# Patient Record
Sex: Female | Born: 1935 | Race: White | Hispanic: No | Smoking: Former smoker
Health system: Southern US, Community
[De-identification: ages and names within clinical notes are randomized; demographics above are authoritative.]

## PROBLEM LIST (undated history)

## (undated) DIAGNOSIS — K5900 Constipation, unspecified: Secondary | ICD-10-CM

## (undated) DIAGNOSIS — K449 Diaphragmatic hernia without obstruction or gangrene: Secondary | ICD-10-CM

## (undated) DIAGNOSIS — K319 Disease of stomach and duodenum, unspecified: Secondary | ICD-10-CM

## (undated) DIAGNOSIS — F419 Anxiety disorder, unspecified: Secondary | ICD-10-CM

## (undated) DIAGNOSIS — I255 Ischemic cardiomyopathy: Secondary | ICD-10-CM

## (undated) DIAGNOSIS — R51 Headache: Secondary | ICD-10-CM

## (undated) DIAGNOSIS — I5022 Chronic systolic (congestive) heart failure: Secondary | ICD-10-CM

## (undated) DIAGNOSIS — R42 Dizziness and giddiness: Secondary | ICD-10-CM

## (undated) DIAGNOSIS — N289 Disorder of kidney and ureter, unspecified: Secondary | ICD-10-CM

## (undated) DIAGNOSIS — Z950 Presence of cardiac pacemaker: Secondary | ICD-10-CM

## (undated) DIAGNOSIS — R079 Chest pain, unspecified: Secondary | ICD-10-CM

## (undated) DIAGNOSIS — I10 Essential (primary) hypertension: Secondary | ICD-10-CM

## (undated) DIAGNOSIS — R519 Headache, unspecified: Secondary | ICD-10-CM

## (undated) DIAGNOSIS — Z889 Allergy status to unspecified drugs, medicaments and biological substances status: Secondary | ICD-10-CM

## (undated) DIAGNOSIS — M1990 Unspecified osteoarthritis, unspecified site: Secondary | ICD-10-CM

## (undated) HISTORY — DX: Headache, unspecified: R51.9

## (undated) HISTORY — DX: Anxiety disorder, unspecified: F41.9

## (undated) HISTORY — DX: Diaphragmatic hernia without obstruction or gangrene: K44.9

## (undated) HISTORY — DX: Dizziness and giddiness: R42

## (undated) HISTORY — DX: Disorder of kidney and ureter, unspecified: N28.9

## (undated) HISTORY — DX: Chest pain, unspecified: R07.9

## (undated) HISTORY — PX: CARDIAC CATHETERIZATION: SHX172

## (undated) HISTORY — PX: OTHER SURGICAL HISTORY: SHX169

## (undated) HISTORY — DX: Constipation, unspecified: K59.00

## (undated) HISTORY — DX: Essential (primary) hypertension: I10

## (undated) HISTORY — DX: Headache: R51

## (undated) HISTORY — DX: Chronic systolic (congestive) heart failure: I50.22

## (undated) HISTORY — DX: Unspecified osteoarthritis, unspecified site: M19.90

## (undated) HISTORY — DX: Ischemic cardiomyopathy: I25.5

## (undated) HISTORY — PX: CORONARY ARTERY BYPASS GRAFT: SHX141

## (undated) HISTORY — DX: Allergy status to unspecified drugs, medicaments and biological substances: Z88.9

## (undated) HISTORY — DX: Presence of cardiac pacemaker: Z95.0

## (undated) HISTORY — DX: Disease of stomach and duodenum, unspecified: K31.9

---

## 2003-09-10 ENCOUNTER — Other Ambulatory Visit: Payer: Self-pay

## 2003-11-10 ENCOUNTER — Ambulatory Visit (HOSPITAL_COMMUNITY): Admission: RE | Admit: 2003-11-10 | Discharge: 2003-11-11 | Payer: Self-pay | Admitting: *Deleted

## 2005-01-06 ENCOUNTER — Other Ambulatory Visit: Payer: Self-pay

## 2005-01-06 ENCOUNTER — Inpatient Hospital Stay: Payer: Self-pay | Admitting: Internal Medicine

## 2005-02-19 ENCOUNTER — Ambulatory Visit: Payer: Self-pay | Admitting: Internal Medicine

## 2005-05-01 ENCOUNTER — Other Ambulatory Visit: Payer: Self-pay

## 2005-05-02 ENCOUNTER — Inpatient Hospital Stay: Payer: Self-pay | Admitting: Internal Medicine

## 2005-09-05 ENCOUNTER — Other Ambulatory Visit: Payer: Self-pay

## 2005-09-05 ENCOUNTER — Emergency Department: Payer: Self-pay | Admitting: Emergency Medicine

## 2005-10-04 ENCOUNTER — Ambulatory Visit: Payer: Self-pay | Admitting: Unknown Physician Specialty

## 2005-11-22 ENCOUNTER — Ambulatory Visit: Payer: Self-pay | Admitting: Internal Medicine

## 2005-12-11 ENCOUNTER — Inpatient Hospital Stay (HOSPITAL_COMMUNITY): Admission: RE | Admit: 2005-12-11 | Discharge: 2005-12-12 | Payer: Self-pay | Admitting: Internal Medicine

## 2005-12-11 ENCOUNTER — Encounter: Payer: Self-pay | Admitting: Cardiology

## 2005-12-11 ENCOUNTER — Ambulatory Visit: Payer: Self-pay | Admitting: Internal Medicine

## 2005-12-27 ENCOUNTER — Ambulatory Visit: Payer: Self-pay

## 2006-10-14 IMAGING — CR DG CHEST 2V
1 series · 2 of 2 positions shown · non-contrast
Comparison: none

REASON FOR EXAM: Facial numbness, dizziness
COMMENTS:  LMP: Post-Menopausal

PROCEDURE:     DXR - DXR CHEST PA (OR AP) AND LATERAL  - January 06, 2005  [DATE]
RESULT:
CLINICAL:  Facial numbness, dizziness, and chest pain.

[Series 1: view not recorded · 0.17mm/px · 2 of 2 slices shown]
[im 1/2]
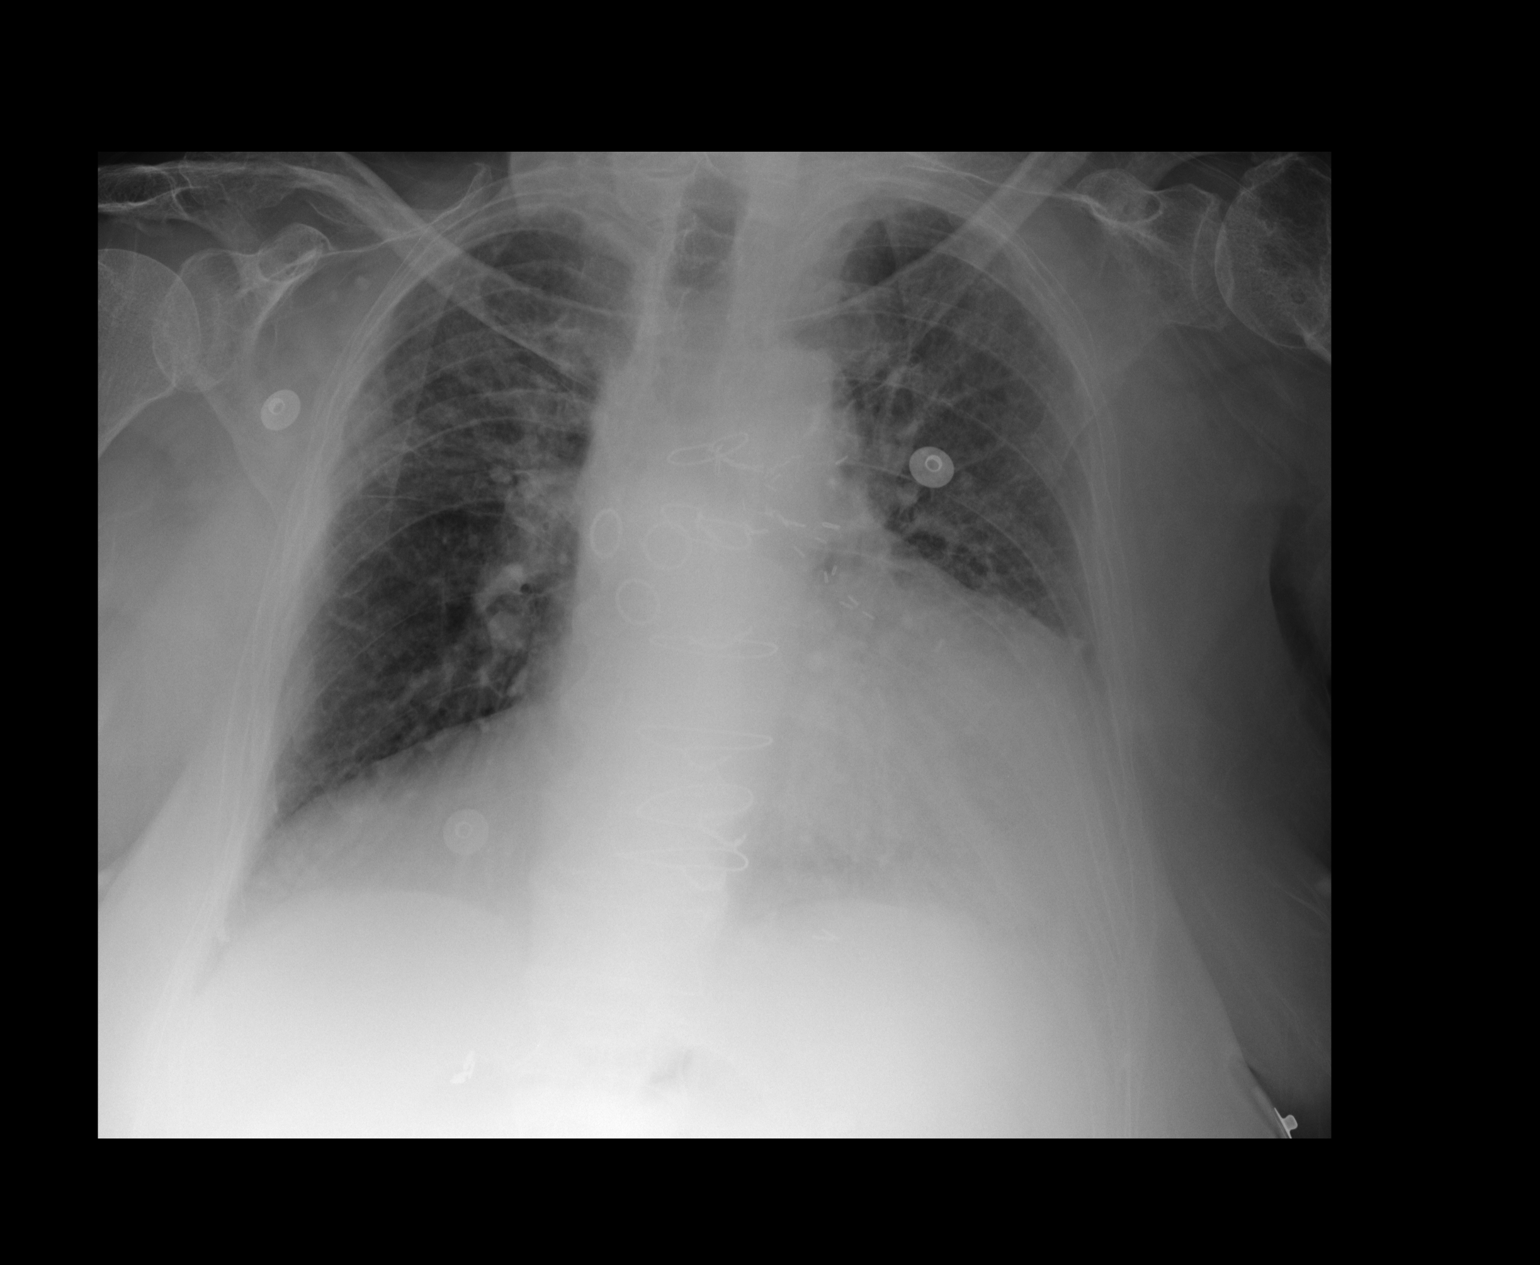
[im 2/2]
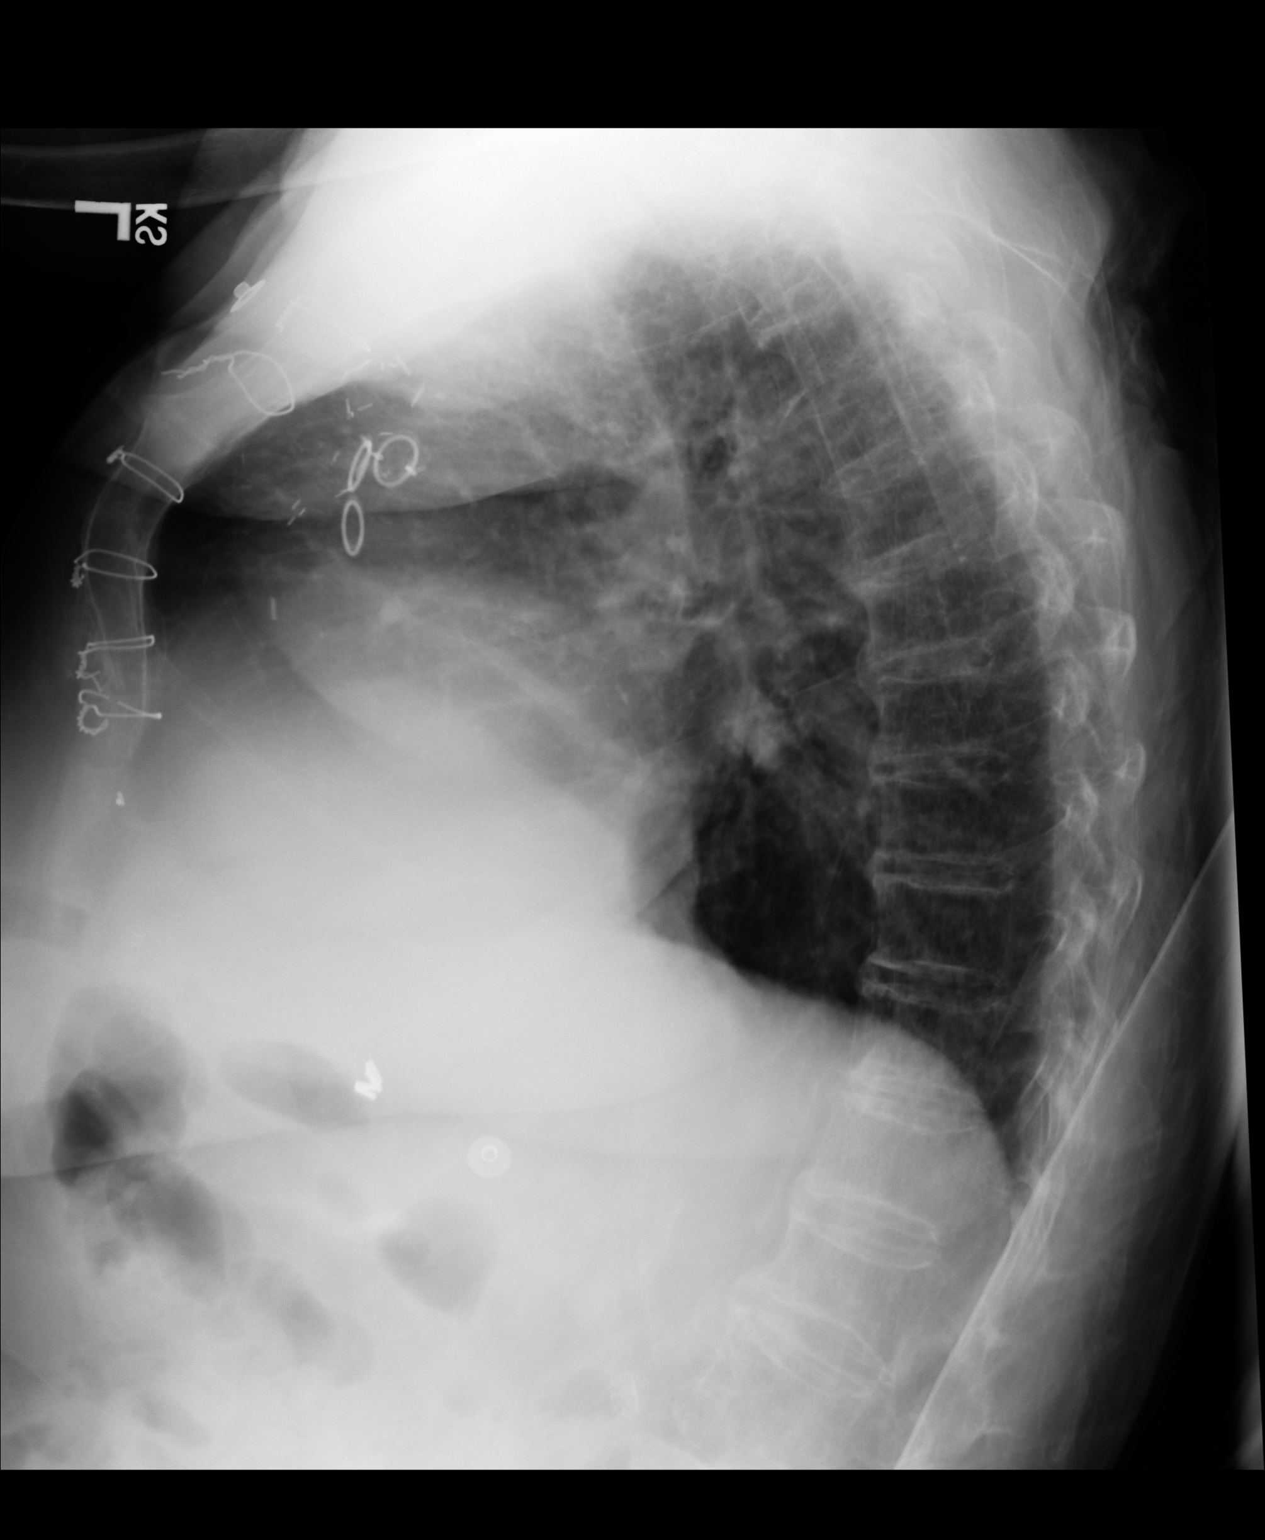

[2 of 2 positions shown; findings below may reference images not displayed]

FINDINGS: AP and lateral views of the chest are compared with the most
recent prior examinations of 11-13-03.

As before there are changes of prior median sternotomy and CABG.  The heart
is markedly enlarged.  Minimal pulmonary vascular congestion remains.  No
focal infiltrate.  No acute fractures.  Deformity of the LEFT humeral head
is again noted.
IMPRESSION: Cardiomegaly and minimal pulmonary edema.  No overt CHF.  No pneumonia.

## 2006-10-14 IMAGING — CT CT HEAD WITHOUT CONTRAST
2 series · 16 of 30 positions shown, 20 images · non-contrast
Comparison: none

REASON FOR EXAM: Facial numbness, dizziness
COMMENTS:  LMP: Post-Menopausal

PROCEDURE:     CT  - CT HEAD WITHOUT CONTRAST  - January 06, 2005  [DATE]
RESULT:
HISTORY: 82-year-old female with facial numbness and dizziness.
TECHNIQUE: Routine unenhanced CT of the head was performed.

[Series 2: without · axial · non-contrast · 0.41mm/px · z∈[-88,+32]mm · 13 of 28 slices shown, 17 images]
[im 2/28  brain]
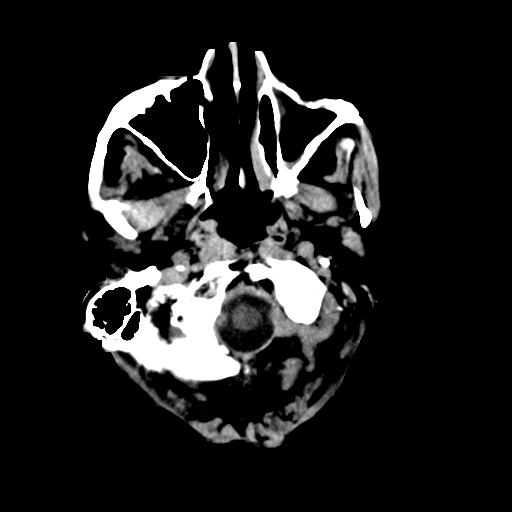
[im 2/28  bone]
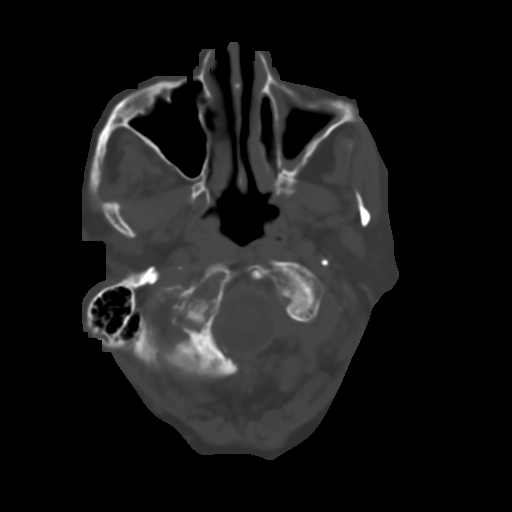
[im 4/28  brain]
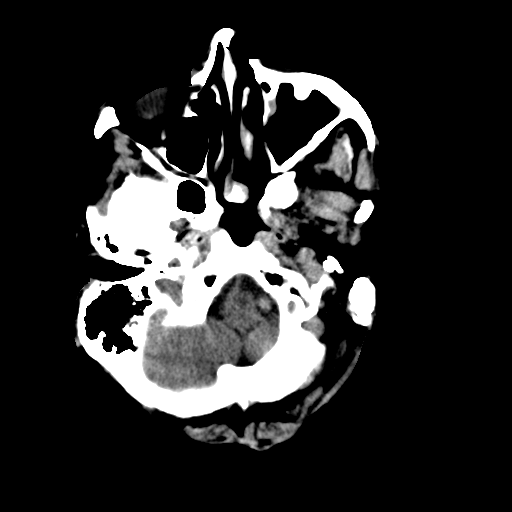
[im 6/28  brain]
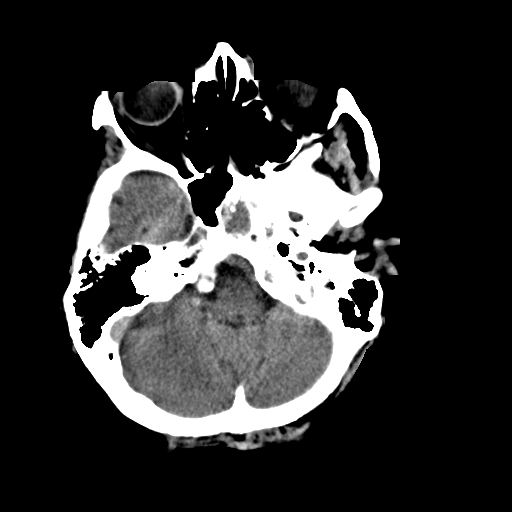
[im 8/28  brain]
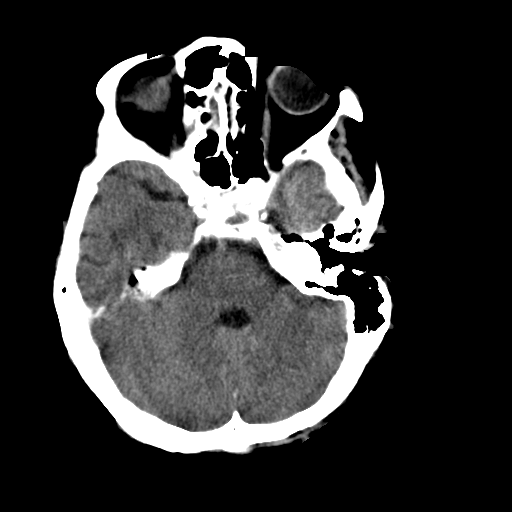
[im 10/28  brain]
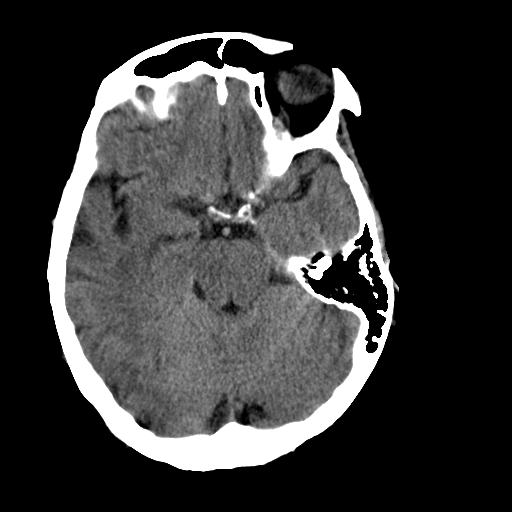
[im 10/28  bone]
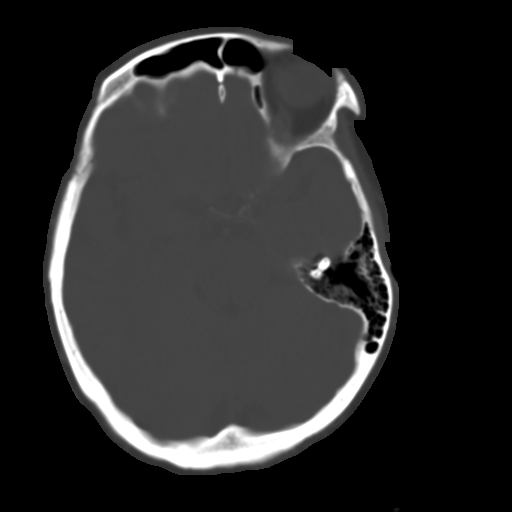
[im 12/28  brain]
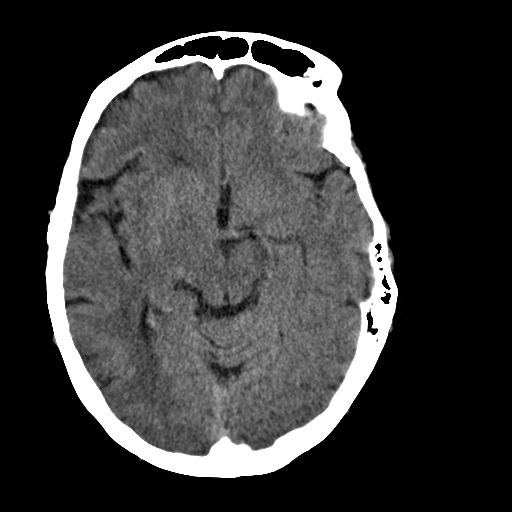
[im 14/28  brain]
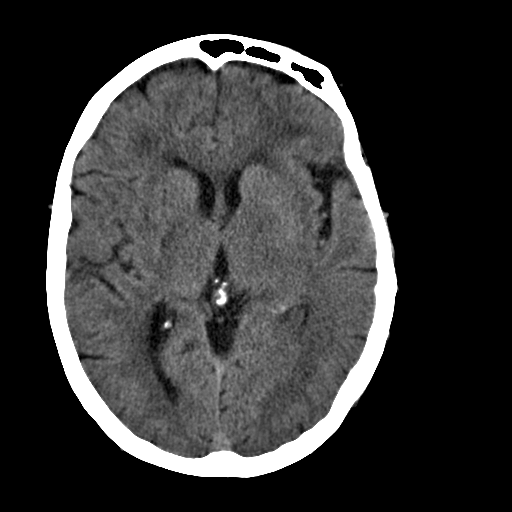
[im 16/28  brain]
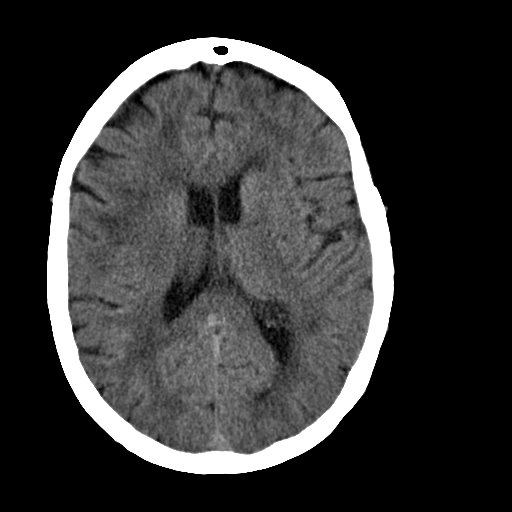
[im 18/28  brain]
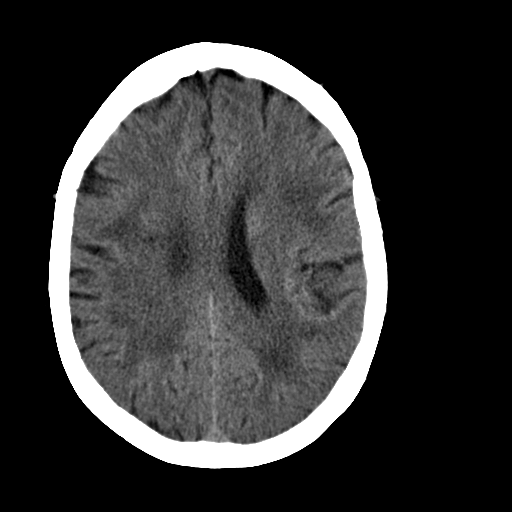
[im 18/28  bone]
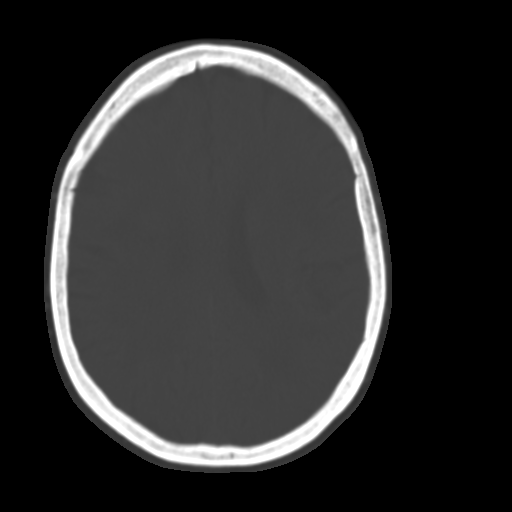
[im 20/28  brain]
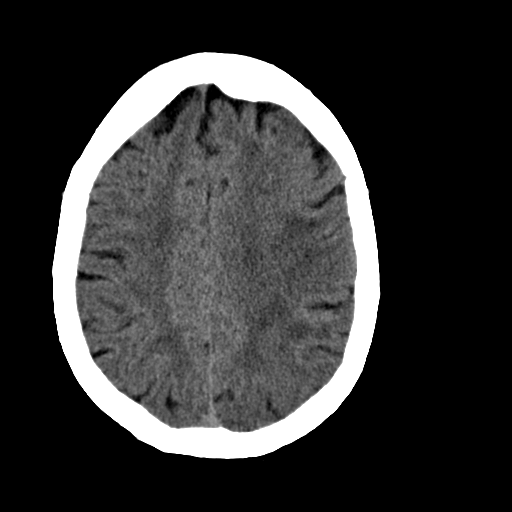
[im 22/28  brain]
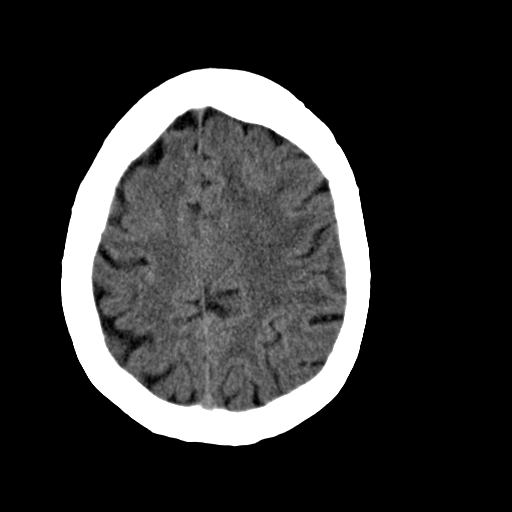
[im 24/28  brain]
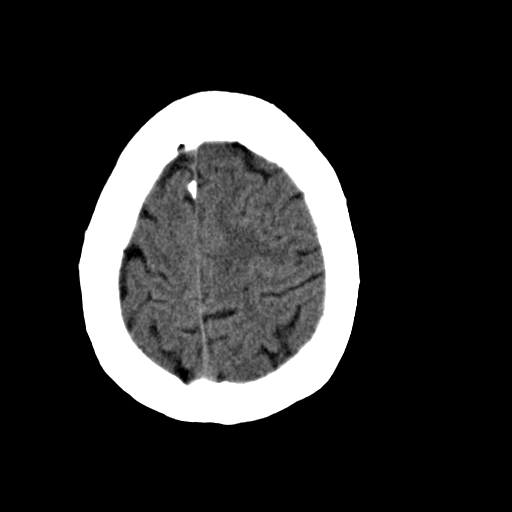
[im 26/28  brain]
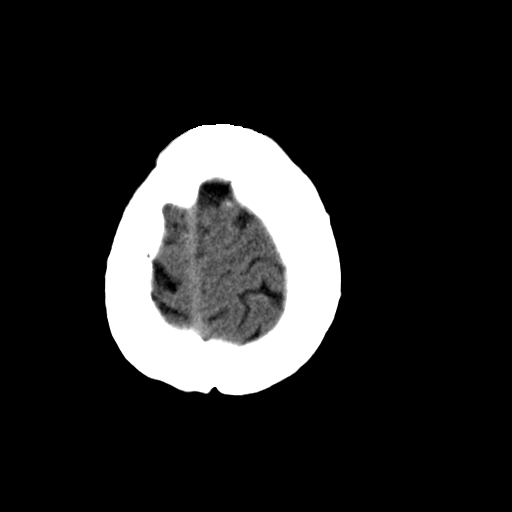
[im 26/28  bone]
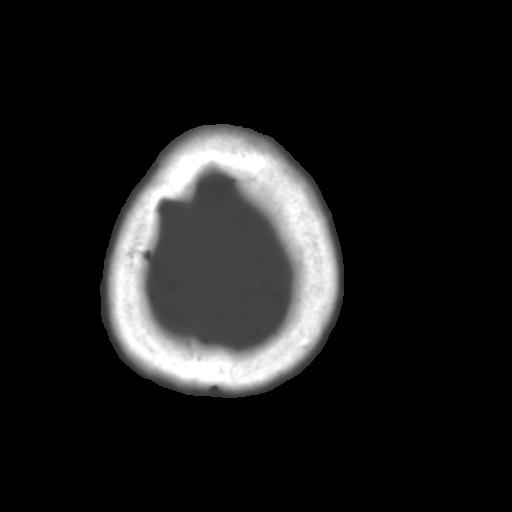

[Series 3: bone windows · axial · 0.41mm/px · z∈[-88,-48]mm · 3 of 28 slices shown]
[im 2/28  bone]
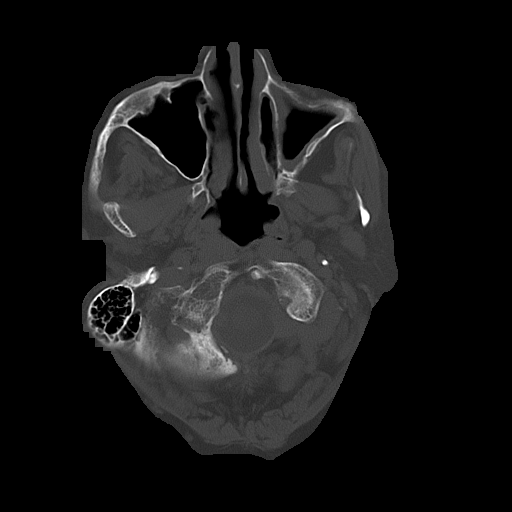
[im 6/28  bone]
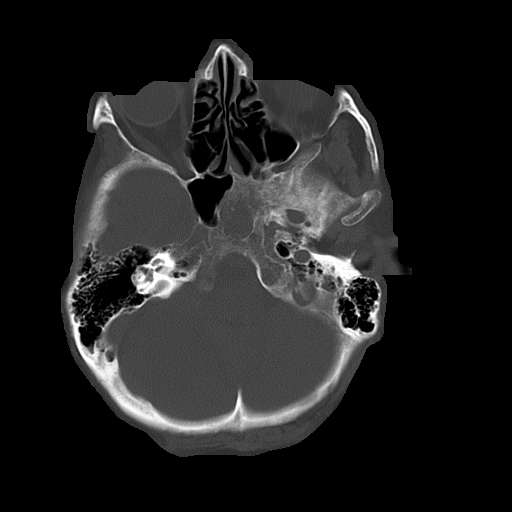
[im 10/28  bone]
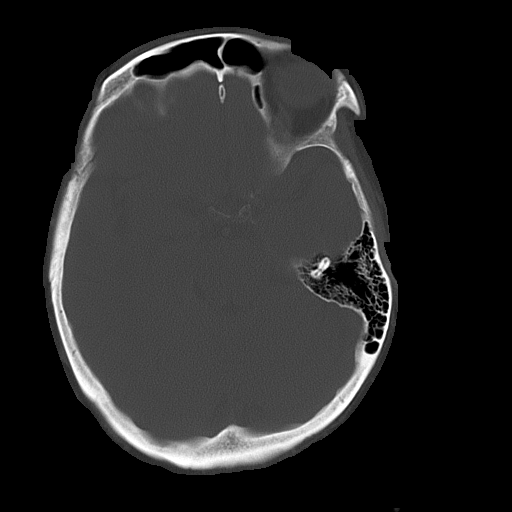

[16 of 30 positions shown; findings below may reference images not displayed]

FINDINGS: Comparison is made with an unenhanced head CT performed
11-13-03.  There is no intracranial hemorrhage or mass effect.  There is a
significant amount of central and deep white matter hypodensity, compatible
with small vessel disease and remote infarcts.  This is stable in appearance
compared with the prior study.  The CSF-containing spaces are within normal
limits.  The bones and soft tissues are grossly unremarkable.  There is
circumferential mucosal thickening within the LEFT maxillary sinus.
IMPRESSION: Severe small vessel disease without definite acute intracranial findings.

LEFT maxillary sinus mucosal thickening.

## 2006-10-25 ENCOUNTER — Ambulatory Visit: Payer: Self-pay | Admitting: Internal Medicine

## 2006-10-30 ENCOUNTER — Ambulatory Visit: Payer: Self-pay | Admitting: Internal Medicine

## 2006-11-14 ENCOUNTER — Emergency Department: Payer: Self-pay | Admitting: Emergency Medicine

## 2006-11-16 ENCOUNTER — Emergency Department: Payer: Self-pay | Admitting: Internal Medicine

## 2007-03-10 ENCOUNTER — Ambulatory Visit: Payer: Self-pay

## 2007-07-31 ENCOUNTER — Ambulatory Visit: Payer: Self-pay | Admitting: Internal Medicine

## 2007-08-18 ENCOUNTER — Ambulatory Visit: Payer: Self-pay | Admitting: Internal Medicine

## 2007-10-14 ENCOUNTER — Other Ambulatory Visit: Payer: Self-pay

## 2007-10-14 ENCOUNTER — Observation Stay: Payer: Self-pay | Admitting: Internal Medicine

## 2008-01-02 ENCOUNTER — Ambulatory Visit: Payer: Self-pay | Admitting: Internal Medicine

## 2008-04-03 ENCOUNTER — Ambulatory Visit: Payer: Self-pay | Admitting: Internal Medicine

## 2008-07-05 ENCOUNTER — Ambulatory Visit: Payer: Self-pay | Admitting: Internal Medicine

## 2008-07-30 ENCOUNTER — Ambulatory Visit: Payer: Self-pay | Admitting: Ophthalmology

## 2008-08-06 IMAGING — RF DG UGI W/ KUB
1 series · 14 of 24 positions shown · non-contrast
Comparison: none

REASON FOR EXAM: dysphagia
COMMENTS:

[Series 1: run · 14 of 27 slices shown]
[im 1/27]
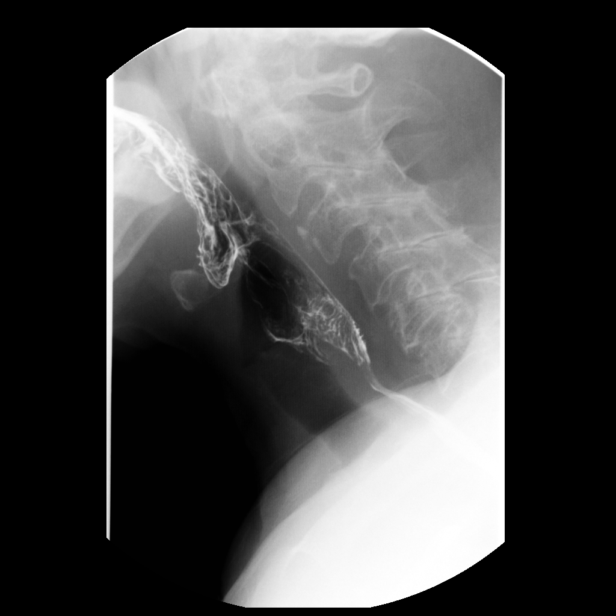
[im 3/27]
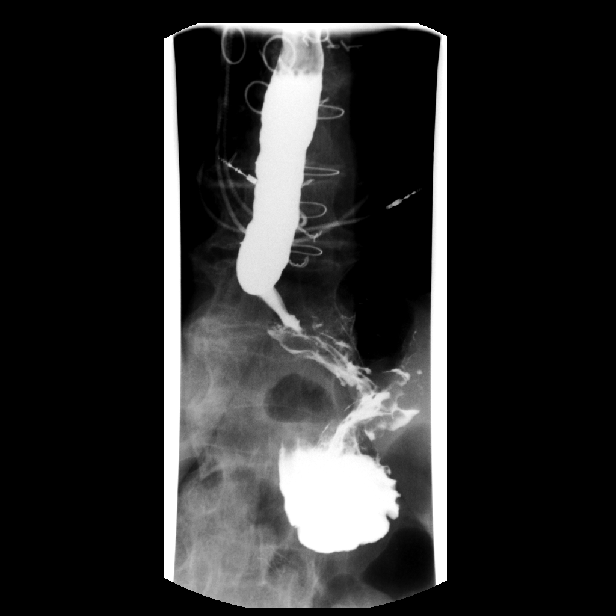
[im 5/27]
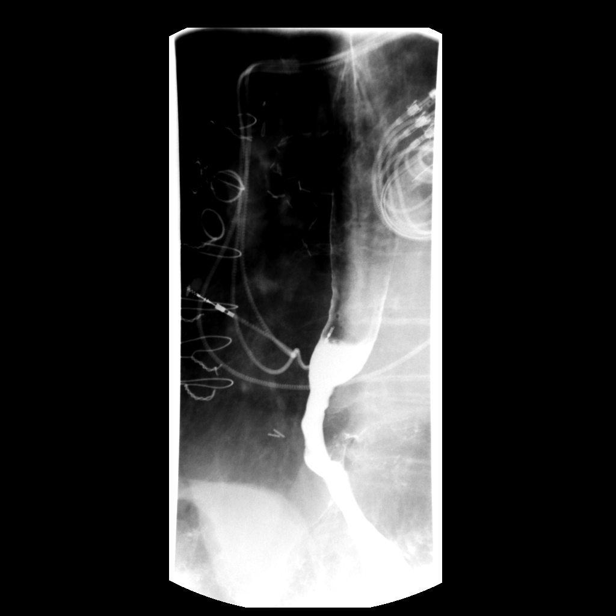
[im 7/27]
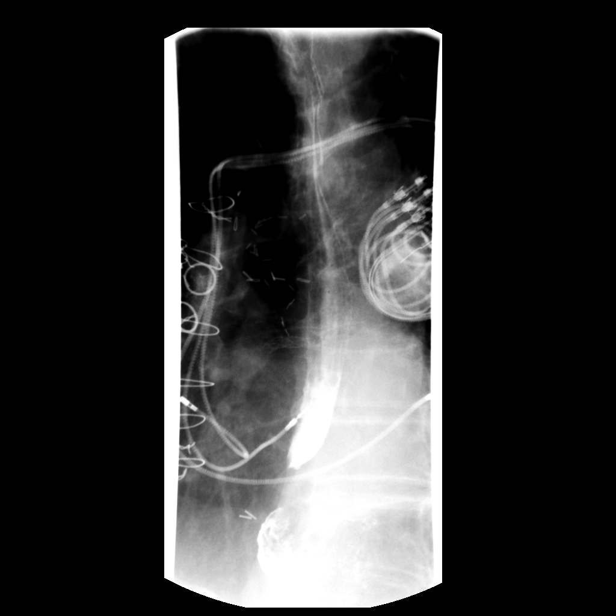
[im 8/27]
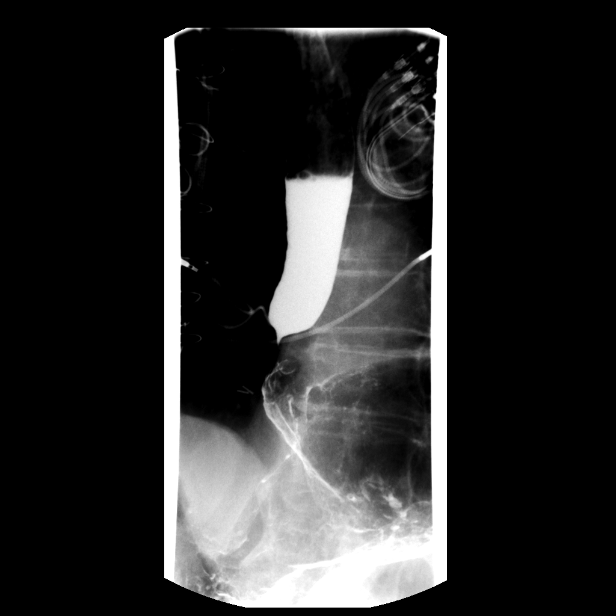
[im 11/27]
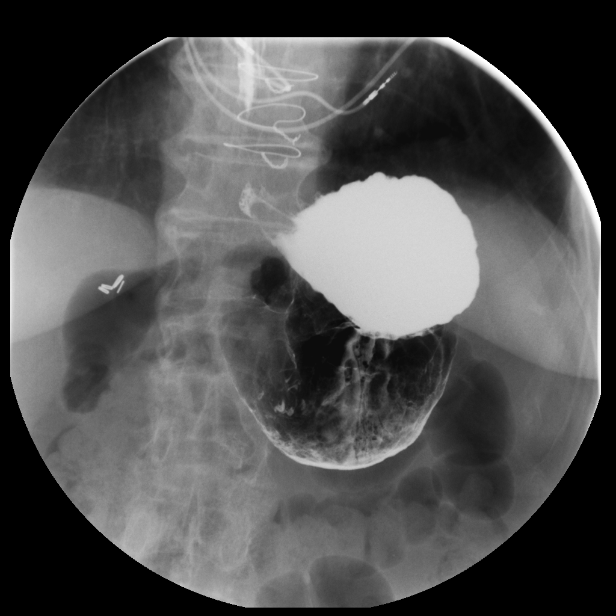
[im 13/27]
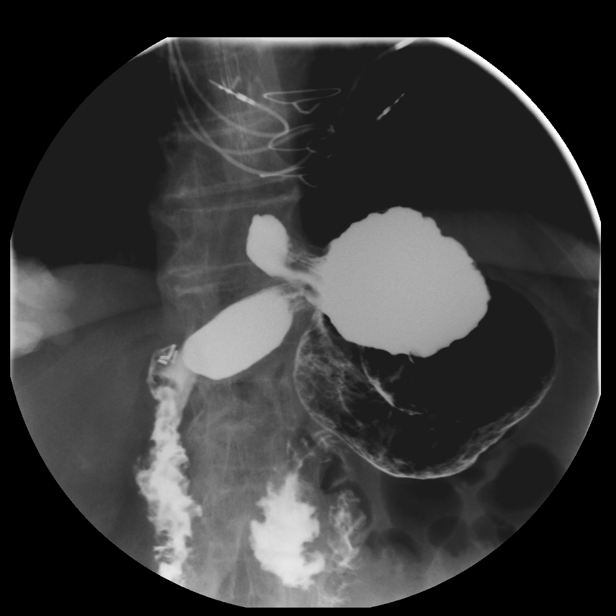
[im 14/27]
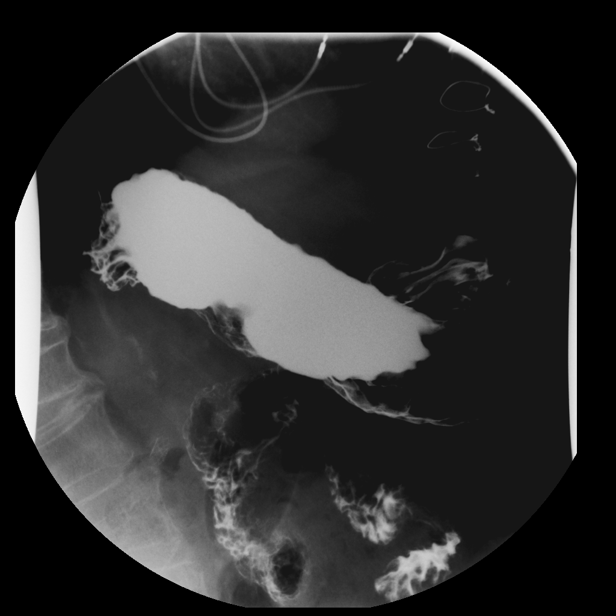
[im 16/27]
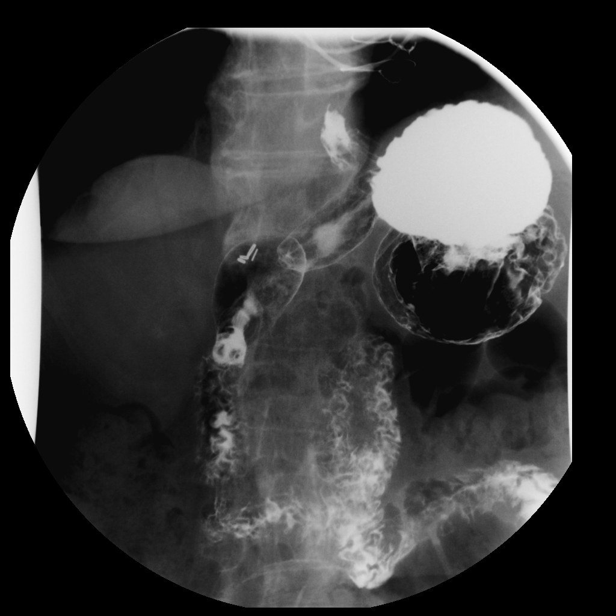
[im 19/27]
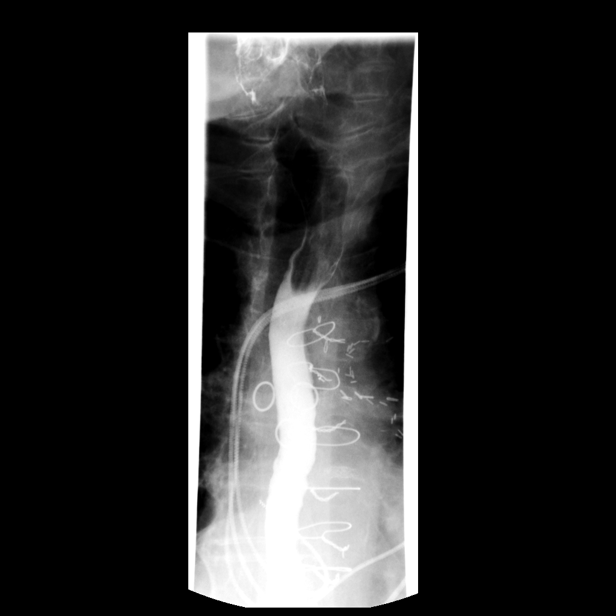
[im 21/27]
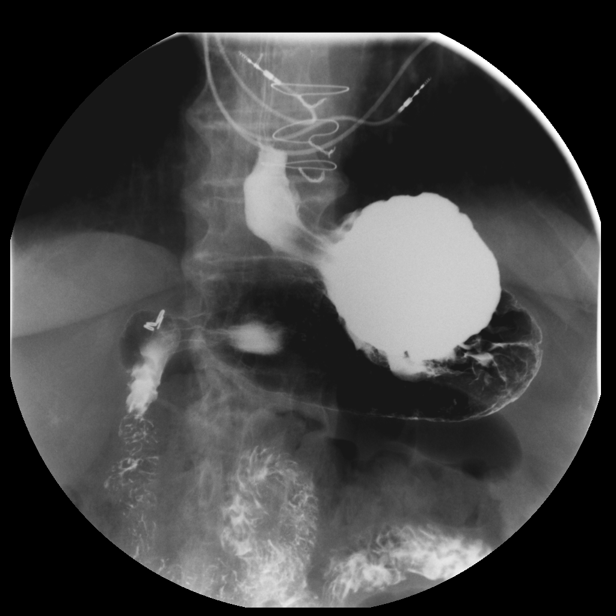
[im 22/27]
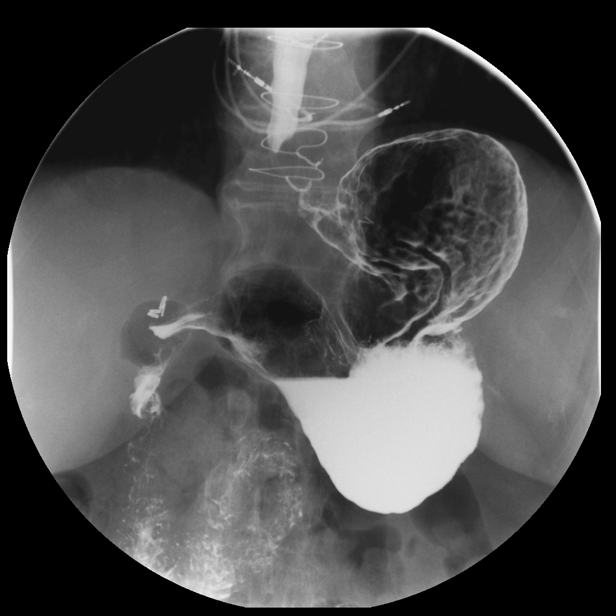
[im 24/27]
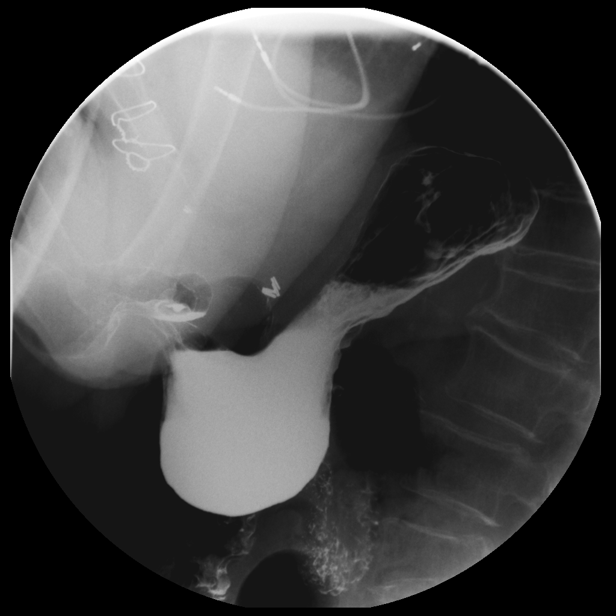
[im 27/27]
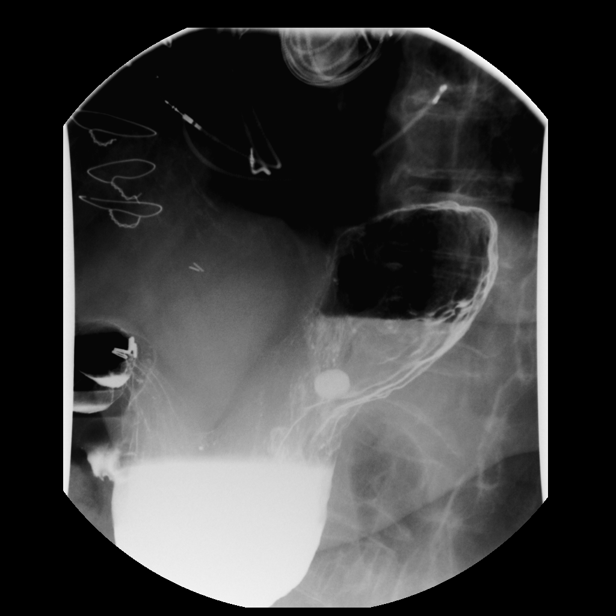

[14 of 24 positions shown; findings below may reference images not displayed]

PROCEDURE:     FL  - FL UPPER GI W/ BARIUM SWALLOW  - October 30, 2006  [DATE]

RESULT:     The anticipated procedure was discussed with Ms. Shergo.  She
voiced her willingness to proceed. The cervical esophagus distended well.
There was no laryngeal penetration of the barium. Initiation of the primary
peristaltic wave was normal. Mild prominence of the cricopharyngeus muscle
was noted. There are degenerative changes of the mid and lower cervical
spine. The thoracic esophagus distended well. The 12 mm barium pill passed
without difficulty. There are mild changes presbyesophagus. No significant
reflux was demonstrated. There is no evidence of esophagitis. There is a
small reducible hiatal hernia.

The stomach distended well. Gastric emptying was prompt. The duodenal bulb
and C-sweep were normal in appearance. There was no evidence of ulceration
or of a mass.
IMPRESSION: 1.     There are mild changes of presbyesophagus. There is a small hiatal
hernia. No significant reflux is seen and there is no evidence of
esophagitis. No laryngeal penetration of barium was demonstrated. The 12 mm
barium pill passed without difficulty. 2. The stomach and duodenum are
normal in appearance.

## 2008-10-04 ENCOUNTER — Ambulatory Visit: Payer: Self-pay | Admitting: Internal Medicine

## 2009-01-04 ENCOUNTER — Ambulatory Visit: Payer: Self-pay | Admitting: Internal Medicine

## 2009-01-04 ENCOUNTER — Encounter: Payer: Self-pay | Admitting: Internal Medicine

## 2009-01-20 ENCOUNTER — Emergency Department: Payer: Self-pay | Admitting: Emergency Medicine

## 2009-03-20 ENCOUNTER — Emergency Department: Payer: Self-pay | Admitting: Emergency Medicine

## 2009-05-09 ENCOUNTER — Ambulatory Visit: Payer: Self-pay | Admitting: Internal Medicine

## 2009-06-13 ENCOUNTER — Emergency Department: Payer: Self-pay | Admitting: Emergency Medicine

## 2009-08-08 ENCOUNTER — Ambulatory Visit: Payer: Self-pay | Admitting: Internal Medicine

## 2009-11-07 ENCOUNTER — Ambulatory Visit: Payer: Self-pay | Admitting: Internal Medicine

## 2010-02-06 ENCOUNTER — Ambulatory Visit: Payer: Self-pay | Admitting: Internal Medicine

## 2010-03-10 ENCOUNTER — Encounter (INDEPENDENT_AMBULATORY_CARE_PROVIDER_SITE_OTHER): Payer: Self-pay | Admitting: *Deleted

## 2010-05-08 ENCOUNTER — Ambulatory Visit: Payer: Self-pay | Admitting: Internal Medicine

## 2010-05-12 ENCOUNTER — Encounter: Payer: Self-pay | Admitting: Internal Medicine

## 2010-08-07 ENCOUNTER — Encounter: Payer: Self-pay | Admitting: Internal Medicine

## 2010-08-15 ENCOUNTER — Ambulatory Visit: Payer: Self-pay | Admitting: Internal Medicine

## 2010-08-22 NOTE — Cardiovascular Report (Signed)
Summary: TTM   TTM   Imported By: Roderic Ovens 08/24/2009 10:21:52  _____________________________________________________________________  External Attachment:    Type:   Image     Comment:   External Document

## 2010-08-22 NOTE — Cardiovascular Report (Signed)
Summary: TTM   TTM   Imported By: Roderic Ovens 11/18/2009 15:06:25  _____________________________________________________________________  External Attachment:    Type:   Image     Comment:   External Document

## 2010-08-22 NOTE — Cardiovascular Report (Signed)
Summary: TTM   TTM   Imported By: Roderic Ovens 02/17/2010 08:51:53  _____________________________________________________________________  External Attachment:    Type:   Image     Comment:   External Document

## 2010-08-22 NOTE — Cardiovascular Report (Signed)
Summary: Certified Letter - Returned  Certified Letter - Returned   Imported By: Debby Freiberg 05/31/2010 14:31:22  _____________________________________________________________________  External Attachment:    Type:   Image     Comment:   External Document

## 2010-08-22 NOTE — Letter (Signed)
Summary: Device-Delinquent Check  [facility], Main Office  [address]   [place], [place] [address]   Phone: [phone]  Fax: [phone]     March 10, 2010 MRN: [id number]   Cassie King [address] [place], [place]  [address]   Dear Ms. Archuletta,  According to our records, you have not had your implanted device checked in the recommended period of time.  We are unable to determine appropriate device function without checking your device on a regular basis.  Please call our office to schedule an appointment, with Dr. Graciela Husbands in [place],  as soon as possible.  If you are having your device checked by another physician, please call us so that we may update our records.  Thank you,  Altha Harm, LPN  March 10, 2010 4:15 PM  [facility]  certified mail

## 2010-08-22 NOTE — Cardiovascular Report (Signed)
Summary: TTM   TTM   Imported By: Roderic Ovens 05/24/2010 14:23:55  _____________________________________________________________________  External Attachment:    Type:   Image     Comment:   External Document

## 2010-08-30 NOTE — Cardiovascular Report (Signed)
Summary: TTM   TTM   Imported By: Roderic Ovens 08/23/2010 16:13:09  _____________________________________________________________________  External Attachment:    Type:   Image     Comment:   External Document

## 2010-09-27 ENCOUNTER — Ambulatory Visit: Payer: Self-pay | Admitting: Gastroenterology

## 2010-12-05 NOTE — Assessment & Plan Note (Signed)
[facility]                         ELECTROPHYSIOLOGY OFFICE NOTE   [name], [name]                        MRN:          [id number]  DATE:03/10/2007                            DOB:          1922-10-17    Cassie King was seen today in the [facility] for followup of her  Medtronic, model [id number] InSynch.  Date of implant was Dec 16, 2005, for  ischemic cardiomyopathy.   On interrogation of her device today, her battery voltage is 3.012.  P  waves measured greater than 2.8 millivolts with an atrial capture  threshold of 1 volt at 0.4 milliseconds and an atrial lead impedance of  495 ohms.  R waves measured greater than 11.2 millivolts with a right  ventricular pacing threshold of 1 volt at 0.4 milliseconds and a right  ventricular lead impedance of 545 ohms.  Left ventricular pacing  threshold was 0.5 volts at 0.4 milliseconds with the left ventricular  lead impedance of 935 ohms.  There were 64 mode switch episodes totaling  zero percent of the time.  She is not on Coumadin therapy to her  knowledge.  No changes were made in her parameters.  She will do her  telephone checks through [employer] with a return office visit in 1 year's  time.      Cassie Harm, LPN  Electronically Signed      Duke Salvia, MD, [facility]  Electronically Signed   PO/MedQ  DD: 03/10/2007  DT: 03/10/2007  Job #: [id number]

## 2010-12-08 ENCOUNTER — Encounter: Payer: Self-pay | Admitting: Internal Medicine

## 2010-12-08 DIAGNOSIS — I428 Other cardiomyopathies: Secondary | ICD-10-CM

## 2010-12-08 NOTE — Op Note (Signed)
NAMEAVILYN, VIRTUE               ACCOUNT NO.:  [id number]   MEDICAL RECORD NO.:  [id number]          PATIENT TYPE:  INP   LOCATION:  2033                         FACILITY:  [facility]   PHYSICIAN:  Duke Salvia, M.D.  DATE OF BIRTH:  [birth year]   DATE OF PROCEDURE:  12/11/2005  DATE OF DISCHARGE:  12/12/2005                                 OPERATIVE REPORT   PREOPERATIVE DIAGNOSIS:  Ischemic cardiomyopathy with left bundle branch  block, congestive heart failure class 3.   POSTOPERATIVE DIAGNOSIS:  Ischemic cardiomyopathy with left bundle branch  block, congestive heart failure class 3.   PROCEDURE:  Dual chamber pacemaker implantation with left ventricular lead  placement.   Following the obtaining of informed consent, the patient was brought to the  electrophysiology laboratory and placed on the fluoroscopic table in supine  position.  After routine prep and drape of the left upper chest, lidocaine  was infiltrated in the prepectoral subclavicular region.  Incision was made  and carried down to the layer of prepectoral fascia with electrocautery and  sharp dissection.  A pocket was formed similarly, hemostasis was obtained.   Thereafter attention was turned to gaining access to the extrathoracic left  subclavian vein which was accomplished without difficulty and without  aspiration of air or puncture of the artery.  Guidewires were placed and  retained and sequentially a 7, 9.5 and 7 French hemostatic tear-away  introducer sheaths were placed through which were passed a Medtronic 5076 52  cm active fixation ventricular lead serial number [id number], a Medtronic  4194 88 cm passive fixation left ventricular lead serial number [id number]  and a Medtronic 5076 45 cm active fixation atrial lead serial number  [id number].  I should note that the right ventricular lead was marked with a  tie.  The right ventricular lead was manipulated under fluoroscopic guidance  to the right  ventricular distal septum where the bipolar R wave was 12.3 mV  with a pacing impedance of 708 ohms and threshold of 0.5 V at 0.5 msec,  current at threshold 0.9 mA and there was no diaphragmatic pacing at 10V.   The coronary sinus was then cannulated without significant difficulty.  However, it turned out to be interesting in that there was a broad  trifurcation of the coronary sinus such that the first lateral branch was  quite posterior and it had with it a 360 degree curlicue in its midportion.  Initially, we tried to pass the 014 wire along side the Wholey wire up to  the more distal coronary sinus.  This turned out to be difficult for me to  accomplish and so I took Attain sheath and placed it through the MB2  catheter over the [facility] wire and this allowed for passage of the sheath  into the more distal coronary sinus.  At this point a wire passed easily  into the lateral vein over which was passed then the aforementioned  Medtronic lead.  In a midposition between base and apex, the R-wave was 30  mV with pacing impedance of 1159 ohms and threshold of 1.2  V at 0.5 msec  with current at threshold of 1.5 mA and again there was no diaphragmatic  pacing at 10 V.  The 9.5 French sheath was removed, the MB2 was left in  place and over the last retaining guidewire the right atrial lead was  manipulated to the right atrial appendage where the bipolar P-wave was 1.4  mV with pacing impedance of 602 ohms and threshold of 1 V at 0.5 msec with  current at threshold 2.1 mA.  These leads were secured to the prepectoral  fascia.  The left ventricular delivery system was then removed.  There was  some backward migration of the lead.  The lead was readvanced into a stable  position and this lead was secured to the prepectoral fascia and the pocket  was then copiously irrigated with antibiotic containing saline solution.  Hemostasis was assured and the leads were then attached to a Medtronic  InSync  8042 pulse generator, serial number [id number].  P synchronous pacing  with right bundle branch block and biventricular pacing was identified.  The  leads and the pulse generator were placed in the pocket, secured to the  prepectoral fascia.  Hemostasis having been obtained, the wound was then  closed in three layers in normal fashion.  Wound was washed, dried, then a  benzoin and Steri-Strip dressing was applied.  Needle count, sponge count,  instrument counts correct at end of procedure according to staff.  The  patient tolerated the procedure without apparent complication.  Fluoroscopy  time was 9 minutes and 37 seconds.           ______________________________  Duke Salvia, M.D.     SCK/MEDQ  D:  12/12/2005  T:  12/13/2005  Job:  [id number]   cc:   electrophys lab   [place] pacemaker cl   Dr. Jamse Mead, [place] cl

## 2010-12-08 NOTE — Discharge Summary (Signed)
NAMESHAUNTEA, LOK               ACCOUNT NO.:  [id number]   MEDICAL RECORD NO.:  [id number]          PATIENT TYPE:  INP   LOCATION:  2033                         FACILITY:  [facility]   PHYSICIAN:  Maple Mirza, P.A. DATE OF BIRTH:  October 22, 1922   DATE OF ADMISSION:  12/11/2005  DATE OF DISCHARGE:  12/12/2005                                 DISCHARGE SUMMARY   ALLERGIES:  No known drug allergies.   PRINCIPAL DIAGNOSIS:  1.  Day #1 status post implant Medtronic In Sync III CRT P.  2.  Postprocedural chest pain, transient.      1.  Echocardiogram negative for pericardial effusion.      2.  No hypotension.  3.  Chronic angina.  4.  III-B congestive heart failure.  5.  Ejection fraction by recent echocardiogram 25-30%.  6.  Left bundle branch block.  7.  Two episodes of syncope, probably neurally mediated.   SECONDARY DIAGNOSES:  1.  Ischemic cardiomyopathy, left ventricular dysfunction.  2.  Three vessel coronary artery disease status post coronary artery bypass      graft surgery.  3.  Catheterization 2005, vein grafts are occluded except for the LIMA.  4.  COPD.  5.  Renal artery stenosis.  6.  History of cerebral vascular accident.   PROCEDURES:  Dec 11, 2005, implantation of Medtronic In [place] III  cardioverted defibrillator with biventricular pacing capability, Dr Sherryl Manges.  The patient had transient postprocedure chest pain, pericardial  effusion was ruled out.  The patient states that at times she does have  stable angina for which she takes nitroglycerin.   BRIEF HISTORY:  Mrs. Purdy is an 77 year old female.  She has a history of  syncope x2.  She also has ischemic heart disease with depressed ejection  fraction.  Recent echocardiogram showed ejection fraction of 25-30%.  She  also has severe congestive heart failure symptoms.  She is III+.  She is  short of breath at less than 100 feet of ambulation.  She does have  nocturnal dyspnea and some peripheral  edema.   Mrs. Berland has syncope in the setting of ischemic heart disease and  significant congestive heart failure.  Options include cardiac  resynchronization with her left bundle branch block and possibility of  defibrillator implantation.  The patient wished to proceed with both the  resynchronization therapy and pacemaker implantation.   HOSPITAL COURSE:  The patient presented electively Dec 11, 2005.  She  underwent implantation of the Medtronic pacemaker with successful  implantation of the left ventricular lead by Dr. Sherryl Manges.  She had  perioperative chest pain consistent overall with her chronic stable angina.  However, she did have a bedside echocardiogram which showed no evidence of  hemodynamic compromise through pericardial effusion.  She remained  normotensive.  Chest pain was episodic.  The patient has had counseling  regarding movement of the left arm for the next four days and incision care  for the next seven days.  Her device has been interrogated.  All values  within normal limits, and no changes are made.  The  devices functions  normally.  Chest x-ray showed that the leads are in appropriate position.  She has follow up at [facility], [address], Thursday,  June 7 at 9:20.  This is for an incision check.  She will present to Dr.  Darrold Junker August 2007.  His office will call for that appointment.  Once  again, she is asked to keep her incision dry for the next seven days, to  sponge bath until Tuesday, May 29.  Pain control with Tylenol 325 mg 1-2  tablets q.4-6 hours.   DISCHARGE MEDICATIONS:  As before admission:  1.  Enteric coated aspirin 81 mg daily.  2.  Celebrex 200 mg daily.  3.  Furosemide 40 mg daily.  4.  Imdur 30 mg daily.  5.  Metoprolol 50 mg b.i.d.  6.  Norvasc 10 mg daily.  7.  Omeprazole 20 mg daily.  8.  K-Dur 20 mEq daily.  9.  Pravastatin 40 mg daily.  10. Nitroglycerin 0.4 mg one tablet under the tongue every 5  minutes x3      doses as needed for chest pain.  11. Combivent inhaler as needed.   LABORATORY DATA:  Serum electrolytes taken May 22:  Sodium 141, potassium 4,  chloride 106, CO2 26, BUN 17, creatinine 1.3, glucose 101.  Complete blood  count was taken on May 14.  White cells 7.1, hemoglobin 14.9, hematocrit  42.9, platelets 233,000.  PT 11, INR 1.0, PTT 32.6.      Maple Mirza, P.A.     GM/MEDQ  D:  12/12/2005  T:  12/12/2005  Job:  [id number]   cc:   Duke Salvia, M.D.  [address]  [address]  [place]  [place] [address]   Jamse Mead, M.D.  [place], [place]

## 2011-01-07 ENCOUNTER — Emergency Department: Payer: Self-pay | Admitting: Emergency Medicine

## 2011-03-09 ENCOUNTER — Encounter: Payer: Self-pay | Admitting: Internal Medicine

## 2011-03-09 DIAGNOSIS — I428 Other cardiomyopathies: Secondary | ICD-10-CM

## 2011-06-08 ENCOUNTER — Encounter: Payer: Self-pay | Admitting: Internal Medicine

## 2011-06-08 DIAGNOSIS — I428 Other cardiomyopathies: Secondary | ICD-10-CM

## 2011-08-08 ENCOUNTER — Emergency Department: Payer: Self-pay

## 2011-09-07 ENCOUNTER — Encounter: Payer: Self-pay | Admitting: Internal Medicine

## 2011-09-07 DIAGNOSIS — I428 Other cardiomyopathies: Secondary | ICD-10-CM

## 2011-10-22 ENCOUNTER — Emergency Department: Payer: Self-pay | Admitting: Unknown Physician Specialty

## 2011-10-22 LAB — URINALYSIS, COMPLETE
Bilirubin,UR: NEGATIVE
Ketone: NEGATIVE
Protein: NEGATIVE
RBC,UR: 1 /HPF (ref 0–5)
WBC UR: 11 /HPF (ref 0–5)

## 2011-10-23 LAB — COMPREHENSIVE METABOLIC PANEL
Albumin: 3.2 g/dL — ABNORMAL LOW (ref 3.4–5.0)
Alkaline Phosphatase: 64 U/L (ref 50–136)
Anion Gap: 11 (ref 7–16)
BUN: 21 mg/dL — ABNORMAL HIGH (ref 7–18)
Creatinine: 1.2 mg/dL (ref 0.60–1.30)
Potassium: 3.2 mmol/L — ABNORMAL LOW (ref 3.5–5.1)
Sodium: 140 mmol/L (ref 136–145)
Total Protein: 6.7 g/dL (ref 6.4–8.2)

## 2011-10-23 LAB — CBC
HGB: 13.1 g/dL (ref 12.0–16.0)
MCH: 32 pg (ref 26.0–34.0)
RBC: 4.09 10*6/uL (ref 3.80–5.20)
WBC: 16.6 10*3/uL — ABNORMAL HIGH (ref 3.6–11.0)

## 2011-10-23 LAB — LIPASE, BLOOD: Lipase: 75 U/L (ref 73–393)

## 2011-10-28 LAB — CULTURE, BLOOD (SINGLE)

## 2012-02-18 DIAGNOSIS — I428 Other cardiomyopathies: Secondary | ICD-10-CM

## 2012-05-21 ENCOUNTER — Encounter: Payer: Self-pay | Admitting: *Deleted

## 2012-05-21 DIAGNOSIS — Z950 Presence of cardiac pacemaker: Secondary | ICD-10-CM | POA: Insufficient documentation

## 2012-05-30 ENCOUNTER — Encounter: Payer: Self-pay | Admitting: Internal Medicine

## 2012-06-10 ENCOUNTER — Encounter: Payer: Self-pay | Admitting: Internal Medicine

## 2012-06-10 ENCOUNTER — Ambulatory Visit (INDEPENDENT_AMBULATORY_CARE_PROVIDER_SITE_OTHER): Payer: Medicare Other | Admitting: Internal Medicine

## 2012-06-10 VITALS — BP 140/70 | HR 63 | Ht 67.0 in | Wt 151.0 lb

## 2012-06-10 DIAGNOSIS — I509 Heart failure, unspecified: Secondary | ICD-10-CM

## 2012-06-10 DIAGNOSIS — I5022 Chronic systolic (congestive) heart failure: Secondary | ICD-10-CM

## 2012-06-10 DIAGNOSIS — R42 Dizziness and giddiness: Secondary | ICD-10-CM

## 2012-06-10 DIAGNOSIS — I255 Ischemic cardiomyopathy: Secondary | ICD-10-CM | POA: Insufficient documentation

## 2012-06-10 DIAGNOSIS — I447 Left bundle-branch block, unspecified: Secondary | ICD-10-CM

## 2012-06-10 DIAGNOSIS — Z950 Presence of cardiac pacemaker: Secondary | ICD-10-CM

## 2012-06-10 DIAGNOSIS — I2589 Other forms of chronic ischemic heart disease: Secondary | ICD-10-CM

## 2012-06-10 LAB — PACEMAKER DEVICE OBSERVATION
AL IMPEDENCE PM: 487 Ohm
AL THRESHOLD: 1 V
ATRIAL PACING PM: 23
BAMS-0001: 150 {beats}/min
RV LEAD IMPEDENCE PM: 504 Ohm

## 2012-06-10 LAB — BASIC METABOLIC PANEL
CO2: 30 mEq/L (ref 19–32)
Chloride: 99 mEq/L (ref 96–112)
GFR: 39.72 mL/min — ABNORMAL LOW (ref >60.00)
Glucose, Bld: 100 mg/dL — ABNORMAL HIGH (ref 70–99)

## 2012-06-10 NOTE — Assessment & Plan Note (Signed)
She is orthostatic intolerance in the setting of volume overload. We will add a low-dose diuretic. I would like to use Aldactone, but we will need to her metabolic status prior to this. She is currently taking low-dose HydroDIURIL. This may or may not be effective as her past medical history referred for renal insufficiency.

## 2012-06-10 NOTE — Patient Instructions (Addendum)
Your physician wants you to follow-up in: 12 months with Dr. Graciela Husbands.  You will receive a reminder letter in the mail two months in advance. If you don't receive a letter, please call our office to schedule the follow-up appointment.  Remote monitoring is used to monitor your Pacemaker or ICD from home. This monitoring reduces the number of office visits required to check your device to one time per year. It allows [place] to keep an eye on the functioning of your device to ensure it is working properly. You are scheduled for a device check from home via Mednet every 3 months. You may send your transmission at any time that day. If you have a wireless device, the transmission will be sent automatically. After your physician reviews your transmission, you will receive a postcard with your next transmission date.  Your physician has requested that you have a lexiscan myoview at [facility]. For further information please visit [email or web address] Please follow instruction sheet, as given.  LABS TODAY:  BMET

## 2012-06-10 NOTE — Progress Notes (Signed)
Patient Care Team: [name] as PCP - General (Family Medicine)   HPI  Cassie King is a 78 y.o. female Seen following CRT D. implantation May 2007 ischemic cardiomyopathy left bundle branch block and congestive heart failure.  She is followed by Dr. Wendi Maya at [facility] She has not been seen for some years  She has complaints of exertional shortness of breath. She has some peripheral edema and nocturnal dyspnea. She denies exertional chest discomfort.  Her other major complaint is orthostatic intolerance which can be aggravated in the shower. It is associated with presyncope.  She underwent stress testing at [facility] under the care of Dr. Juliann Pares maybe a couple of years ago. She does not recall being told there is anything abnormal. Her exercise intolerance is considerably worse since that time.  She was recently started on Prasugrel by her PCP for reasons that are not clear .  Past Medical History  Diagnosis Date  . Heart attack     25+ years ago  . Chest pain   . Head ache   . Light headed   . SOB (shortness of breath)   . Dizziness   . Hypertension   . Multiple allergies   . Constipation   . Stomach problems   . Hiatal hernia   . Kidney disease   . Arthritis   . Other urinary problems   . Anxiety   . Depression     Past Surgical History  Procedure Date  . Coronary artery bypass graft     at [facility] over 10 years ago  . Cardiac catheterization   . Stents     Current Outpatient Prescriptions  Medication Sig Dispense Refill  . amLODipine (NORVASC) 10 MG tablet       . aspirin 81 MG tablet Take 81 mg by mouth daily.      Marland Kitchen esomeprazole (NEXIUM) 40 MG capsule Take 40 mg by mouth daily before breakfast.      . hydrochlorothiazide (HYDRODIURIL) 25 MG tablet       . isosorbide mononitrate (IMDUR) 30 MG 24 hr tablet       . lisinopril (PRINIVIL,ZESTRIL) 20 MG tablet Take 20 mg by mouth daily.      Marland Kitchen lubiprostone (AMITIZA) 24 MCG capsule Take 24 mcg by mouth daily.      .  meloxicam (MOBIC) 7.5 MG tablet       . NITROSTAT 0.4 MG SL tablet       . prasugrel (EFFIENT) 10 MG TABS Take by mouth.      . sertraline (ZOLOFT) 25 MG tablet       . simvastatin (ZOCOR) 20 MG tablet       . traMADol-acetaminophen (ULTRACET) 37.5-325 MG per tablet         No Known Allergies  Review of Systems negative except from HPI and PMH  Physical Exam BP 128/59  Pulse 70  Ht 5\' 7"  (1.702 m)  Wt 151 lb (68.493 kg)  BMI 23.65 kg/m2 Well developed and well nourished in no acute distress HENT normal E scleral and icterus clear Neck Supple Significant kyphosisi  JVP 8-10 +HJR; carotids brisk and full Clear to ausculation  Regular rate and rhythm, 2/6 murmur along the right sternal border. Split S2 is retained Soft with active bowel sounds No clubbing cyanosis Trace Edema Alert and oriented, grossly normal motor and sensory function Skin Warm and Dry  ECG demonstrates AV pacing  Assessment and  Plan

## 2012-06-10 NOTE — Assessment & Plan Note (Signed)
Patient has significant orthostatic lightheadedness. At home they have recorded high blood pressures with these events; these likely are occurring after she is becoming supine. We have reviewed maneuvers.

## 2012-06-10 NOTE — Assessment & Plan Note (Signed)
The patient has remote bypass surgery with symptoms of exercise intolerance and nocturnal dyspnea. She is evidence of volume overload. We do not know her renal function. She had a negative Myoview a couple of years ago, we think.  Her lipids are apparently at goal. She is currently on Prasugrel reasons that I don't know.  Will undertake a Myoview scan.

## 2012-06-26 ENCOUNTER — Ambulatory Visit: Payer: Self-pay | Admitting: Internal Medicine

## 2012-06-26 DIAGNOSIS — R0989 Other specified symptoms and signs involving the circulatory and respiratory systems: Secondary | ICD-10-CM

## 2012-06-26 DIAGNOSIS — R0609 Other forms of dyspnea: Secondary | ICD-10-CM

## 2012-06-30 ENCOUNTER — Telehealth: Payer: Self-pay

## 2012-06-30 NOTE — Telephone Encounter (Signed)
Message copied by Marcelle Overlie on Mon Jun 30, 2012  8:10 AM ------      Message from: Duke Salvia      Created: Sat Jun 28, 2012  2:42 PM      Regarding: FW: stress test       .piip      Please Inform Patien nuclear test looked pretty good, please make sure she has followup            Thanks            ----- Message -----         From: Iran Ouch, MD         Sent: 06/26/2012   2:11 PM           To: Duke Salvia, MD, Marcelle Overlie, RN      Subject: stress test                                              Lexiscan nuclear stress test done today showed evidence of large mid-distal and apical infarct with only minimal peri-infarct ischemia. EF:40-45%.

## 2012-06-30 NOTE — Telephone Encounter (Signed)
Pt's dtr, Alice, informed Understanding verb Has recall for 12 months and device check as scheduled

## 2012-06-30 NOTE — Telephone Encounter (Signed)
lmtcb

## 2012-07-01 ENCOUNTER — Other Ambulatory Visit: Payer: Self-pay | Admitting: *Deleted

## 2012-07-01 DIAGNOSIS — I447 Left bundle-branch block, unspecified: Secondary | ICD-10-CM

## 2012-07-01 DIAGNOSIS — I255 Ischemic cardiomyopathy: Secondary | ICD-10-CM

## 2012-07-01 DIAGNOSIS — I5022 Chronic systolic (congestive) heart failure: Secondary | ICD-10-CM

## 2012-07-01 DIAGNOSIS — Z950 Presence of cardiac pacemaker: Secondary | ICD-10-CM

## 2012-07-01 DIAGNOSIS — R42 Dizziness and giddiness: Secondary | ICD-10-CM

## 2012-09-10 ENCOUNTER — Encounter: Payer: Self-pay | Admitting: Internal Medicine

## 2012-09-10 DIAGNOSIS — I498 Other specified cardiac arrhythmias: Secondary | ICD-10-CM

## 2012-10-13 ENCOUNTER — Emergency Department: Payer: Self-pay | Admitting: Unknown Physician Specialty

## 2012-10-13 LAB — COMPREHENSIVE METABOLIC PANEL
Albumin: 3.5 g/dL (ref 3.4–5.0)
Anion Gap: 6 — ABNORMAL LOW (ref 7–16)
BUN: 18 mg/dL (ref 7–18)
Calcium, Total: 8.3 mg/dL — ABNORMAL LOW (ref 8.5–10.1)
Chloride: 107 mmol/L (ref 98–107)
Co2: 29 mmol/L (ref 21–32)
Creatinine: 1.43 mg/dL — ABNORMAL HIGH (ref 0.60–1.30)
Glucose: 98 mg/dL (ref 65–99)
Potassium: 3 mmol/L — ABNORMAL LOW (ref 3.5–5.1)
Sodium: 142 mmol/L (ref 136–145)

## 2012-10-13 LAB — URINALYSIS, COMPLETE
Bilirubin,UR: NEGATIVE
Blood: NEGATIVE
Ph: 5 (ref 4.5–8.0)
Protein: NEGATIVE
RBC,UR: 1 /HPF (ref 0–5)
Specific Gravity: 1.015 (ref 1.003–1.030)
Squamous Epithelial: 4
WBC UR: 4 /HPF (ref 0–5)

## 2012-10-13 LAB — CBC
MCHC: 34.9 g/dL (ref 32.0–36.0)
MCV: 92 fL (ref 80–100)
RDW: 13.7 % (ref 11.5–14.5)

## 2012-12-11 DIAGNOSIS — I428 Other cardiomyopathies: Secondary | ICD-10-CM

## 2013-03-12 ENCOUNTER — Encounter: Payer: Self-pay | Admitting: Internal Medicine

## 2013-03-12 DIAGNOSIS — I428 Other cardiomyopathies: Secondary | ICD-10-CM

## 2013-07-14 ENCOUNTER — Encounter: Payer: Self-pay | Admitting: Internal Medicine

## 2013-07-14 DIAGNOSIS — I428 Other cardiomyopathies: Secondary | ICD-10-CM

## 2013-10-13 ENCOUNTER — Encounter: Payer: Self-pay | Admitting: Internal Medicine

## 2013-12-21 ENCOUNTER — Encounter: Payer: Self-pay | Admitting: Cardiology

## 2013-12-22 ENCOUNTER — Telehealth: Payer: Self-pay | Admitting: Internal Medicine

## 2013-12-22 NOTE — Telephone Encounter (Signed)
12-21-13 sent past due device check letter certified/mt ° °

## 2014-01-12 ENCOUNTER — Encounter: Payer: Self-pay | Admitting: Internal Medicine

## 2014-01-12 DIAGNOSIS — I498 Other specified cardiac arrhythmias: Secondary | ICD-10-CM

## 2014-04-13 DIAGNOSIS — I498 Other specified cardiac arrhythmias: Secondary | ICD-10-CM

## 2014-07-13 ENCOUNTER — Encounter: Payer: Self-pay | Admitting: Internal Medicine

## 2014-07-13 DIAGNOSIS — I495 Sick sinus syndrome: Secondary | ICD-10-CM

## 2014-11-16 ENCOUNTER — Encounter: Payer: Self-pay | Admitting: Internal Medicine

## 2014-11-16 DIAGNOSIS — I5022 Chronic systolic (congestive) heart failure: Secondary | ICD-10-CM | POA: Diagnosis not present

## 2015-02-21 DEATH — deceased
